# Patient Record
Sex: Female | Born: 1980 | Race: White | Hispanic: No | Marital: Single | Smoking: Current every day smoker
Health system: Southern US, Community
[De-identification: ages and names within clinical notes are randomized; demographics above are authoritative.]

## PROBLEM LIST (undated history)

## (undated) DIAGNOSIS — O039 Complete or unspecified spontaneous abortion without complication: Secondary | ICD-10-CM

## (undated) DIAGNOSIS — K219 Gastro-esophageal reflux disease without esophagitis: Secondary | ICD-10-CM

## (undated) HISTORY — PX: DILATION AND CURETTAGE OF UTERUS: SHX78

## (undated) HISTORY — DX: Gastro-esophageal reflux disease without esophagitis: K21.9

## (undated) HISTORY — PX: CHOLECYSTECTOMY, LAPAROSCOPIC: SHX56

## (undated) HISTORY — DX: Complete or unspecified spontaneous abortion without complication: O03.9

---

## 2004-04-15 ENCOUNTER — Emergency Department: Payer: Self-pay | Admitting: Emergency Medicine

## 2004-04-16 ENCOUNTER — Ambulatory Visit: Payer: Self-pay | Admitting: Emergency Medicine

## 2004-04-19 ENCOUNTER — Ambulatory Visit: Payer: Self-pay

## 2004-06-01 ENCOUNTER — Emergency Department: Payer: Self-pay | Admitting: Emergency Medicine

## 2004-06-05 ENCOUNTER — Ambulatory Visit: Payer: Self-pay | Admitting: Internal Medicine

## 2004-06-06 ENCOUNTER — Ambulatory Visit: Payer: Self-pay | Admitting: Internal Medicine

## 2004-06-06 ENCOUNTER — Inpatient Hospital Stay: Payer: Self-pay | Admitting: Surgery

## 2004-06-17 ENCOUNTER — Ambulatory Visit: Payer: Self-pay | Admitting: Surgery

## 2004-11-18 ENCOUNTER — Emergency Department: Payer: Self-pay | Admitting: Emergency Medicine

## 2004-11-19 ENCOUNTER — Emergency Department: Payer: Self-pay | Admitting: Unknown Physician Specialty

## 2005-05-23 ENCOUNTER — Ambulatory Visit: Payer: Self-pay | Admitting: Obstetrics & Gynecology

## 2005-06-03 ENCOUNTER — Ambulatory Visit: Payer: Self-pay | Admitting: Unknown Physician Specialty

## 2005-07-10 ENCOUNTER — Inpatient Hospital Stay: Payer: Self-pay | Admitting: Unknown Physician Specialty

## 2006-03-14 ENCOUNTER — Emergency Department: Payer: Self-pay | Admitting: Emergency Medicine

## 2007-08-09 ENCOUNTER — Emergency Department: Payer: Self-pay | Admitting: Emergency Medicine

## 2008-06-21 ENCOUNTER — Emergency Department: Payer: Self-pay | Admitting: Emergency Medicine

## 2009-08-03 ENCOUNTER — Emergency Department: Payer: Self-pay

## 2009-09-22 ENCOUNTER — Emergency Department: Payer: Self-pay | Admitting: Internal Medicine

## 2010-06-17 ENCOUNTER — Emergency Department: Payer: Self-pay | Admitting: Emergency Medicine

## 2010-06-21 ENCOUNTER — Emergency Department: Payer: Self-pay | Admitting: Unknown Physician Specialty

## 2010-07-03 ENCOUNTER — Emergency Department: Payer: Self-pay | Admitting: Emergency Medicine

## 2012-02-10 ENCOUNTER — Emergency Department: Payer: Self-pay | Admitting: Emergency Medicine

## 2012-02-10 LAB — COMPREHENSIVE METABOLIC PANEL
Albumin: 3.3 g/dL — ABNORMAL LOW (ref 3.4–5.0)
Alkaline Phosphatase: 82 U/L (ref 50–136)
Bilirubin,Total: 0.3 mg/dL (ref 0.2–1.0)
Calcium, Total: 8.9 mg/dL (ref 8.5–10.1)
Chloride: 112 mmol/L — ABNORMAL HIGH (ref 98–107)
Creatinine: 0.67 mg/dL (ref 0.60–1.30)
EGFR (African American): >60
EGFR (Non-African Amer.): >60
Glucose: 89 mg/dL (ref 65–99)
SGPT (ALT): 18 U/L (ref 12–78)
Sodium: 143 mmol/L (ref 136–145)

## 2012-02-10 LAB — CBC
HGB: 14.2 g/dL (ref 12.0–16.0)
MCH: 30.6 pg (ref 26.0–34.0)
MCHC: 34.8 g/dL (ref 32.0–36.0)
MCV: 88 fL (ref 80–100)
Platelet: 156 10*3/uL (ref 150–440)
WBC: 10 10*3/uL (ref 3.6–11.0)

## 2012-05-26 ENCOUNTER — Emergency Department: Payer: Self-pay | Admitting: Emergency Medicine

## 2012-05-26 LAB — URINALYSIS, COMPLETE
Bilirubin,UR: NEGATIVE
Glucose,UR: NEGATIVE mg/dL (ref 0–75)
Hyaline Cast: 7
Ketone: NEGATIVE
Leukocyte Esterase: NEGATIVE
Ph: 7 (ref 4.5–8.0)
Protein: NEGATIVE
RBC,UR: 418 /HPF (ref 0–5)
Specific Gravity: 1.026 (ref 1.003–1.030)
Squamous Epithelial: 2
WBC UR: 2 /HPF (ref 0–5)

## 2012-05-26 LAB — COMPREHENSIVE METABOLIC PANEL
Alkaline Phosphatase: 85 U/L (ref 50–136)
Anion Gap: 9 (ref 7–16)
Bilirubin,Total: 0.5 mg/dL (ref 0.2–1.0)
Calcium, Total: 9.8 mg/dL (ref 8.5–10.1)
Chloride: 107 mmol/L (ref 98–107)
Co2: 24 mmol/L (ref 21–32)
EGFR (African American): >60
Potassium: 4.1 mmol/L (ref 3.5–5.1)
SGOT(AST): 16 U/L (ref 15–37)
SGPT (ALT): 20 U/L (ref 12–78)
Sodium: 140 mmol/L (ref 136–145)
Total Protein: 8.1 g/dL (ref 6.4–8.2)

## 2012-05-26 LAB — PREGNANCY, URINE: Pregnancy Test, Urine: NEGATIVE m[IU]/mL

## 2012-05-26 LAB — CBC
HCT: 48.4 % — ABNORMAL HIGH (ref 35.0–47.0)
HGB: 16.5 g/dL — ABNORMAL HIGH (ref 12.0–16.0)
MCH: 30.5 pg (ref 26.0–34.0)
MCHC: 34 g/dL (ref 32.0–36.0)
Platelet: 219 10*3/uL (ref 150–440)

## 2012-07-15 IMAGING — CT CT ABD-PELV W/O CM
1 of 2 series · 15 of 32 positions shown, 19 images · non-contrast
Comparison: none

REASON FOR EXAM: (1) left flank pain; (2) left flank pain
COMMENTS:

PROCEDURE:     CT  - CT ABDOMEN AND PELVIS W[DATE]  [DATE]
RESULT:
Comparison is made to a prior study dated 08/09/2007.
TECHNIQUE: Helical noncontrast 3 mm sections were obtained from the lung
bases through the pubic symphysis.

[Series 2: stone · axial · 0.77mm/px · z∈[-1022,-608]mm · 15 of 152 slices shown, 19 images]
[im 7/152  soft-tissue]
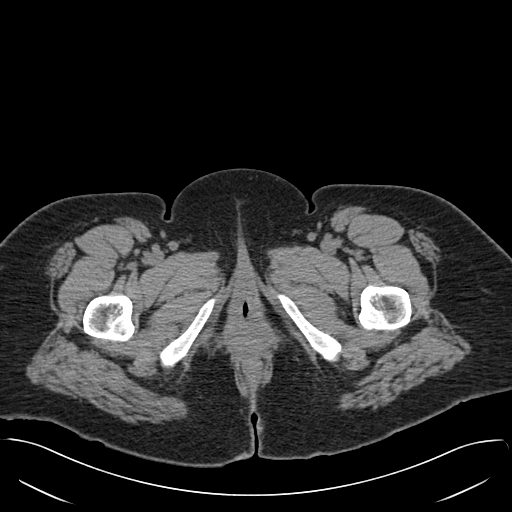
[im 7/152  bone]
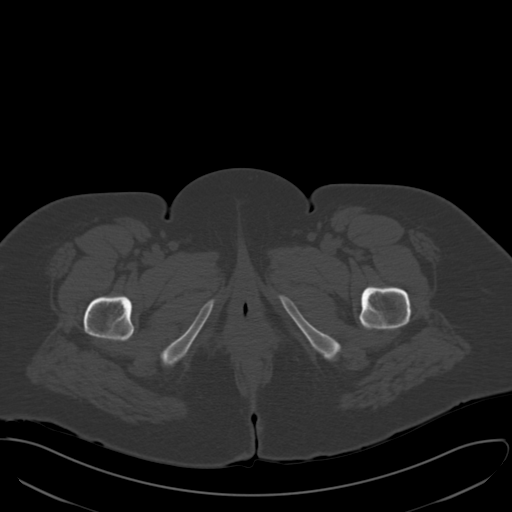
[im 19/152  soft-tissue]
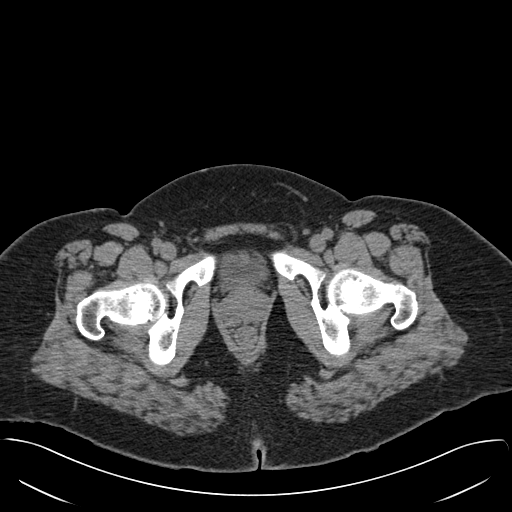
[im 31/152  soft-tissue]
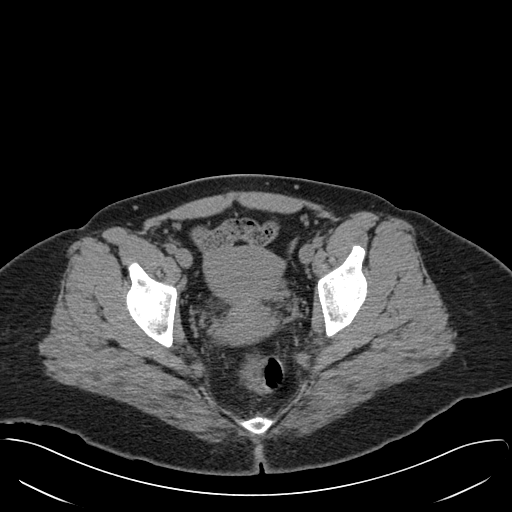
[im 43/152  soft-tissue]
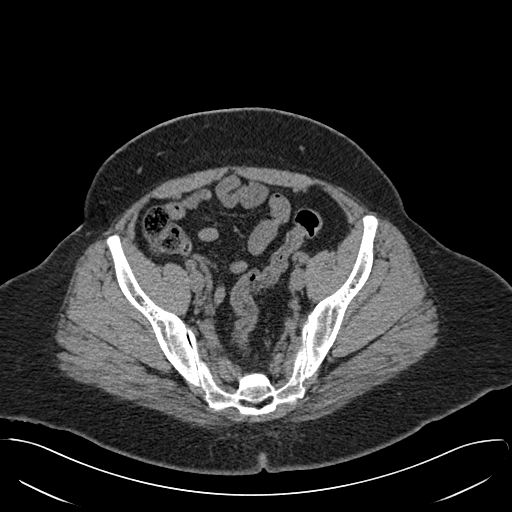
[im 55/152  soft-tissue]
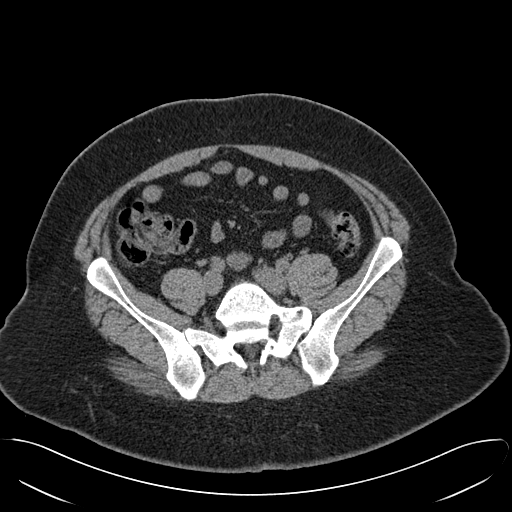
[im 67/152  soft-tissue]
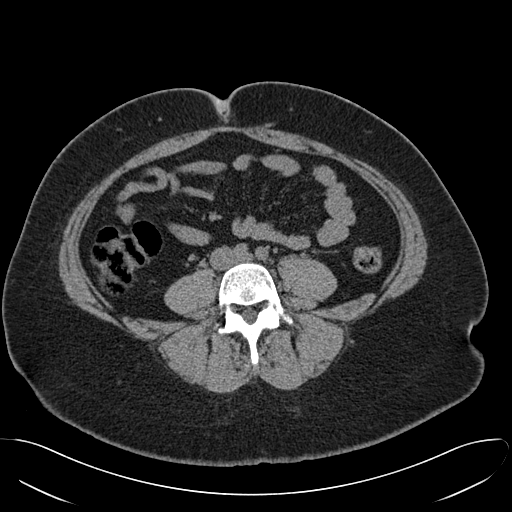
[im 79/152  soft-tissue]
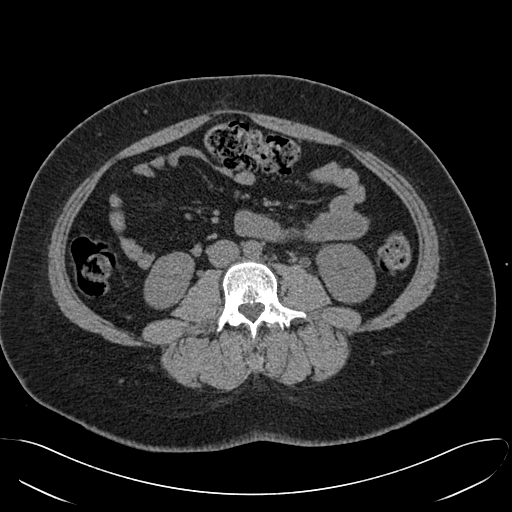
[im 85/152  soft-tissue]
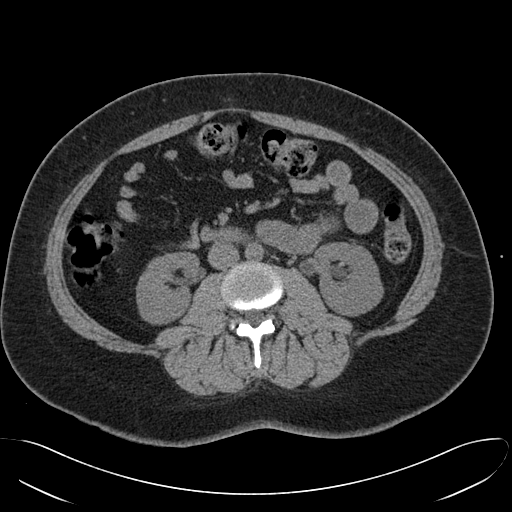
[im 97/152  soft-tissue]
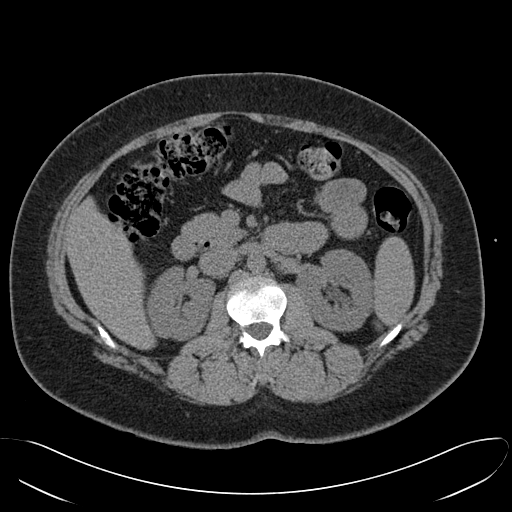
[im 97/152  bone]
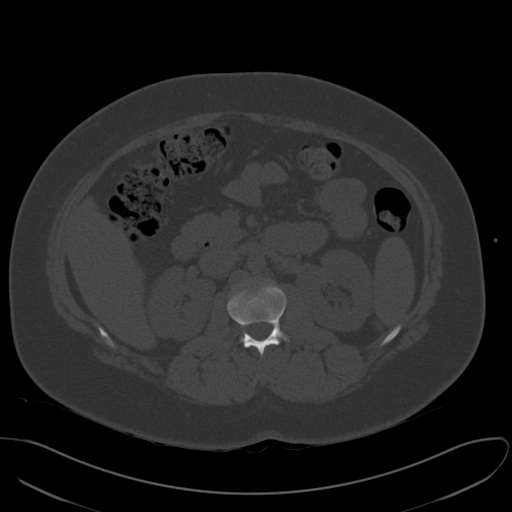
[im 109/152  soft-tissue]
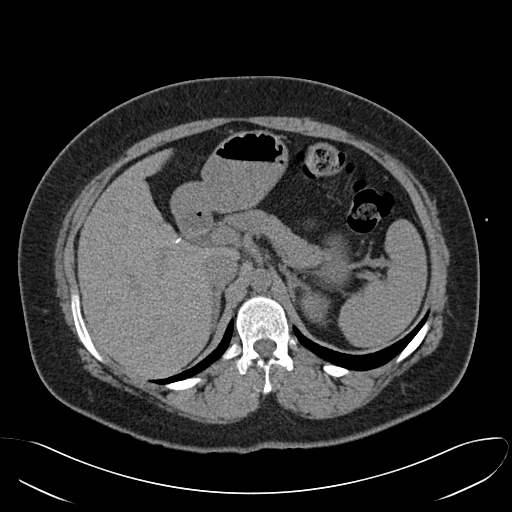
[im 121/152  soft-tissue]
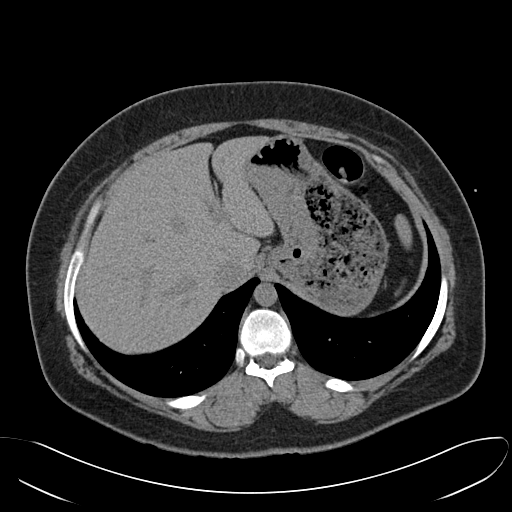
[im 127/152  lung]
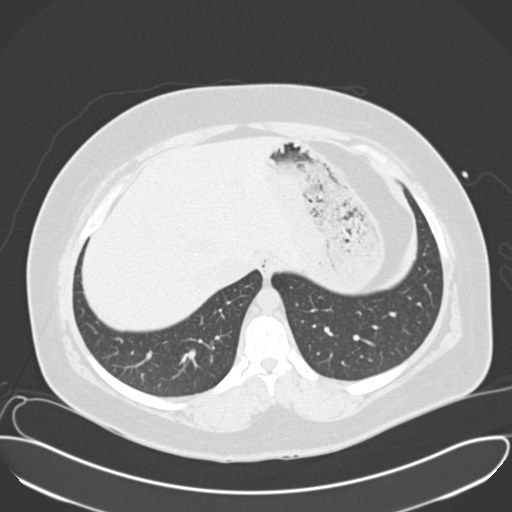
[im 133/152  soft-tissue]
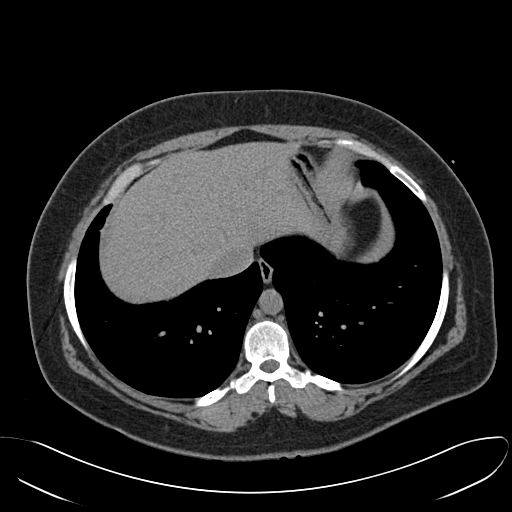
[im 133/152  lung]
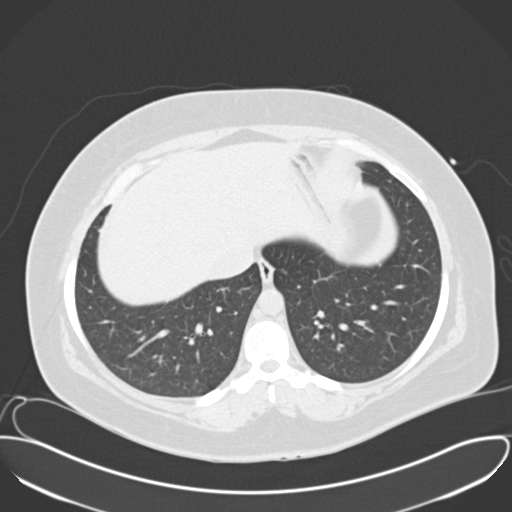
[im 139/152  lung]
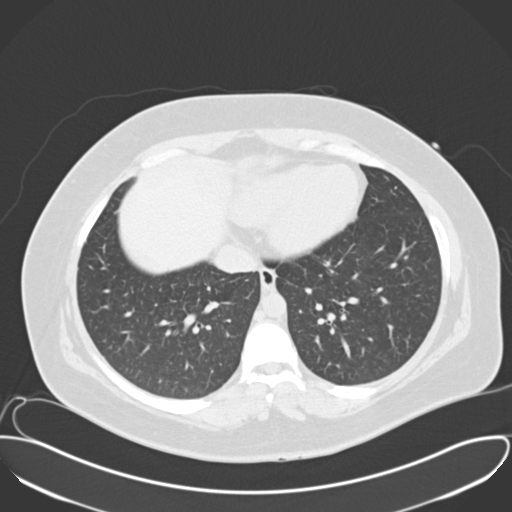
[im 145/152  soft-tissue]
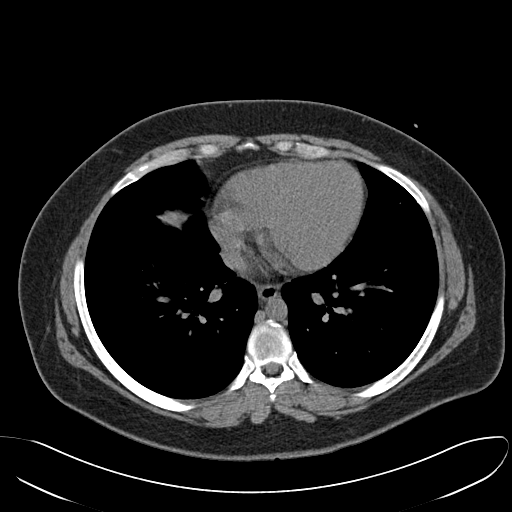
[im 145/152  lung]
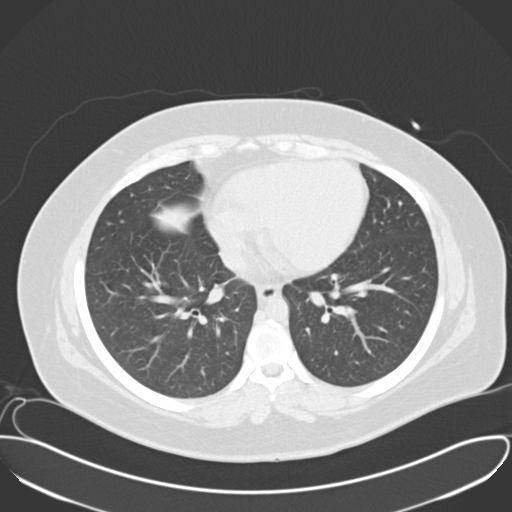

[15 of 32 positions shown; findings below may reference images not displayed]

FINDINGS: The lung bases are unremarkable.

Noncontrast evaluation of the liver, spleen, adrenals and pancreas is
unremarkable. Evaluation of the left kidney demonstrates mild hydronephrosis
with mild hydroureter, proximally. There is otherwise no further evidence of
hydronephrosis, hydroureter, nephrolithiasis or ureterolithiasis.

There is no CT evidence of bowel obstruction or secondary signs reflecting
enteritis, colitis, diverticulitis or appendicitis within the limitations of
a noncontrast CT. There is no evidence of an abdominal aortic aneurysm.
IMPRESSION: 1.  Proximal left ureteral calculus with associated mild obstructive
uropathy.
2.  Dr. Jumper of the Emergency Department was informed of these findings
via a preliminary faxed report.

## 2014-06-24 IMAGING — US US RENAL KIDNEY
1 series · 14 of 24 positions shown · non-contrast
Comparison: none

REASON FOR EXAM: left flank pain
COMMENTS:

PROCEDURE:     US  - US KIDNEY  - May 26, 2012  [DATE]
RESULT:     Comparison: 06/21/2010
TECHNIQUE: Multiple grayscale and color Doppler images were obtained of the
bilateral kidneys.

[Series 1: us renal kidney · 0.28mm/px · 14 of 24 slices shown]
[im 1/24]
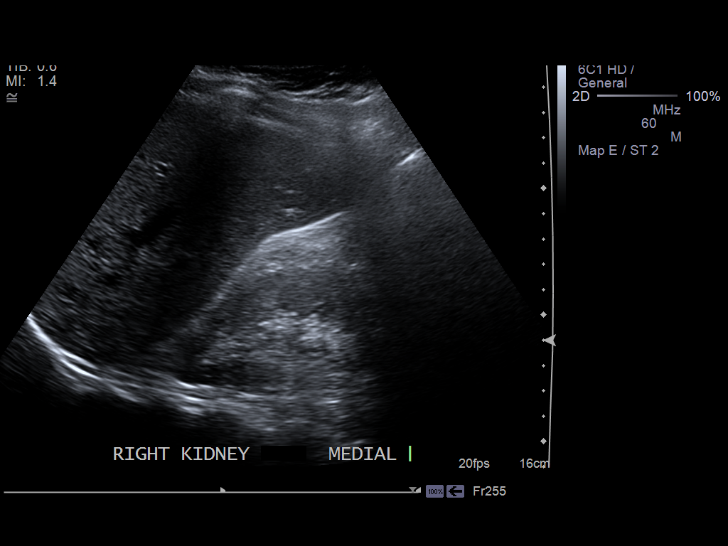
[im 3/24]
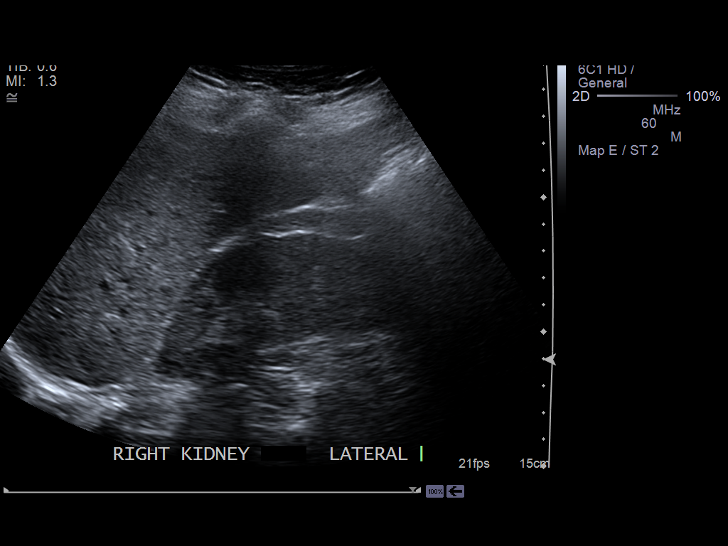
[im 5/24]
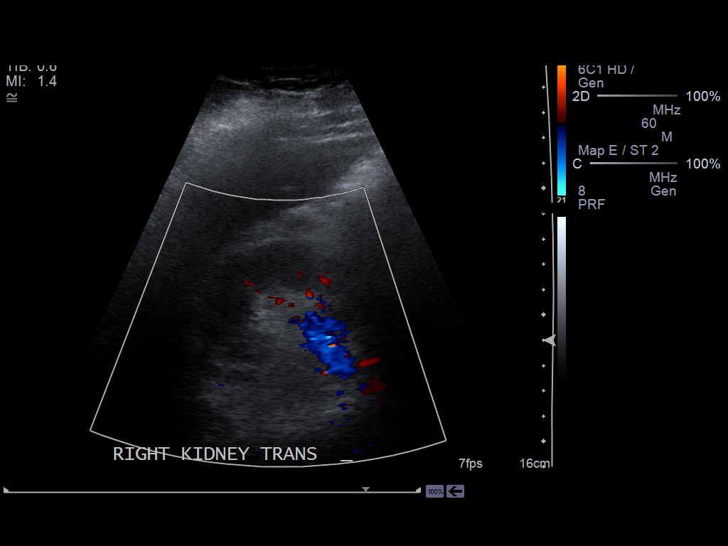
[im 7/24]
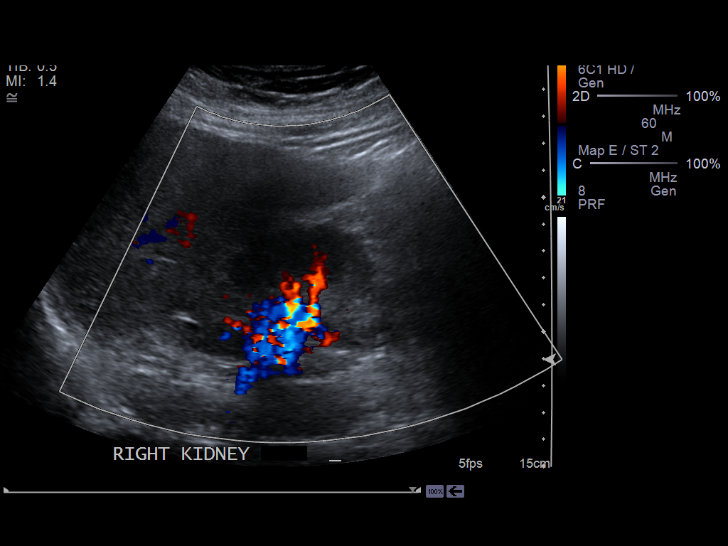
[im 8/24]
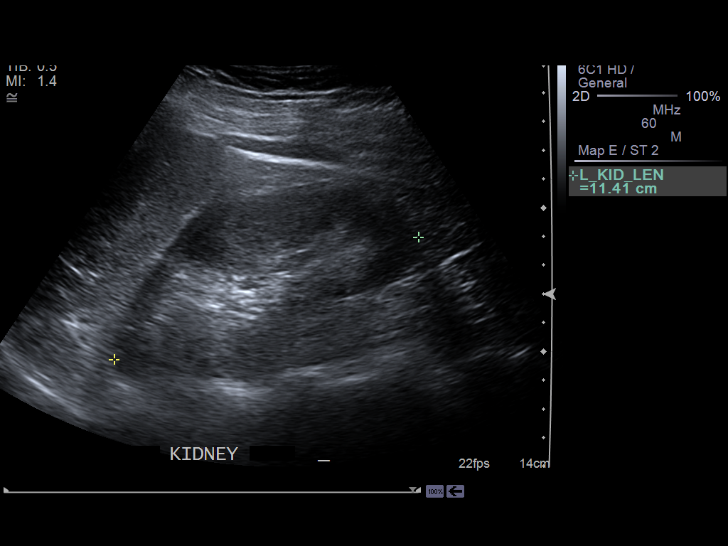
[im 10/24]
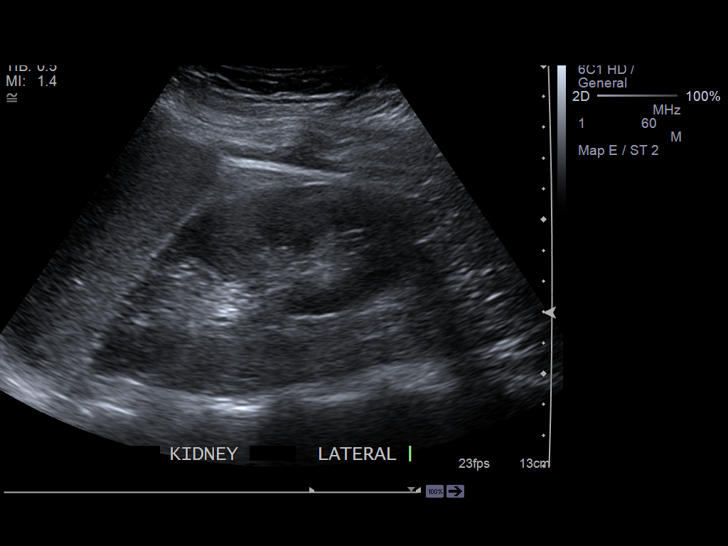
[im 12/24]
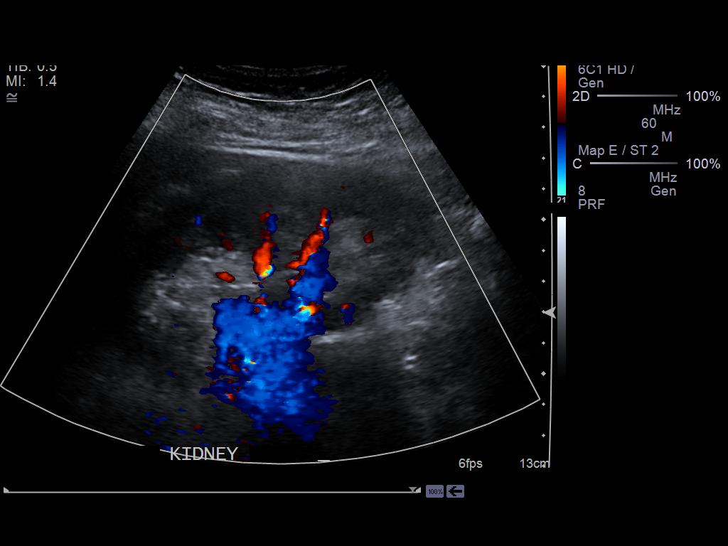
[im 13/24]
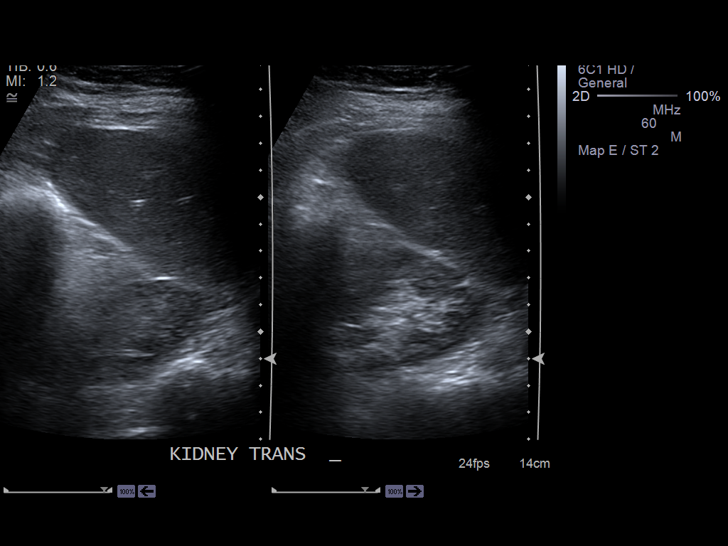
[im 15/24]
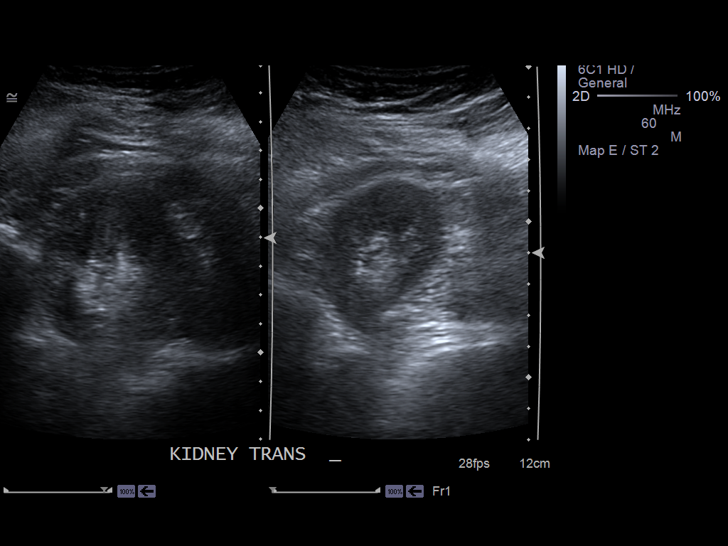
[im 17/24]
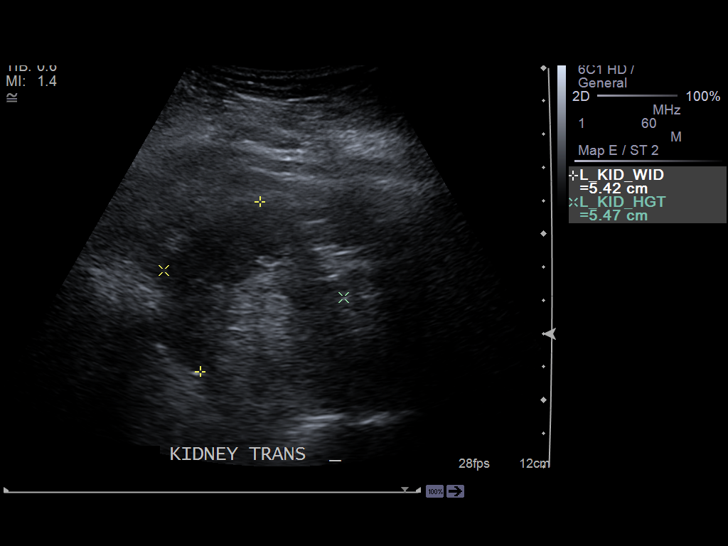
[im 19/24]
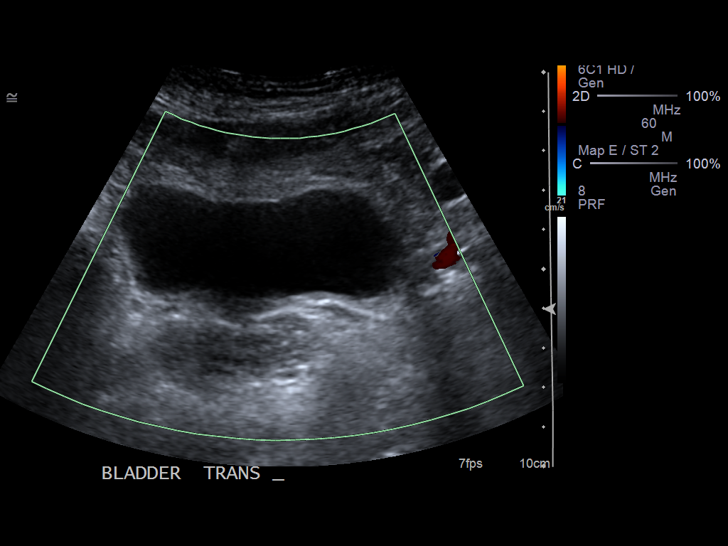
[im 20/24]
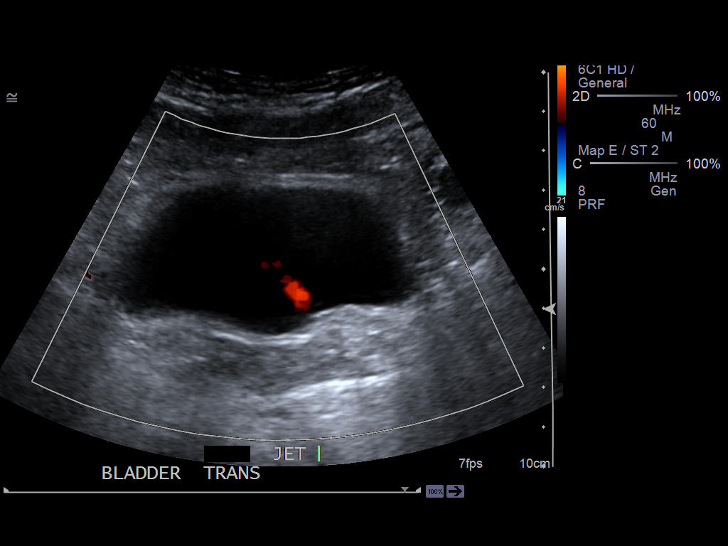
[im 22/24]
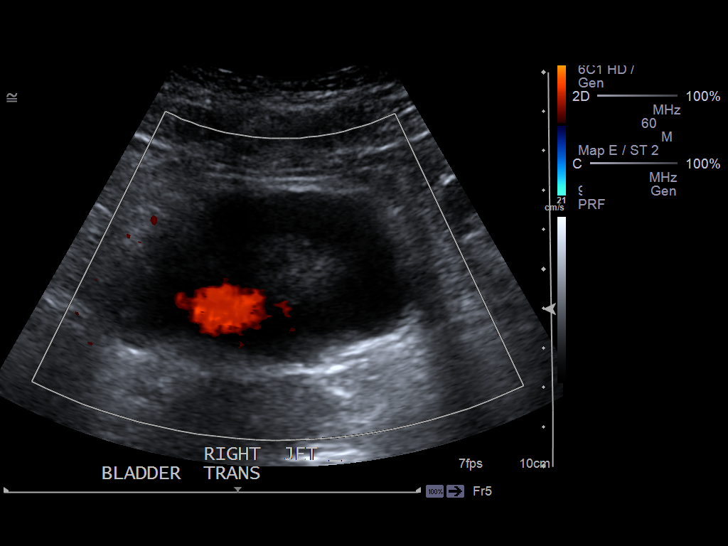
[im 24/24]
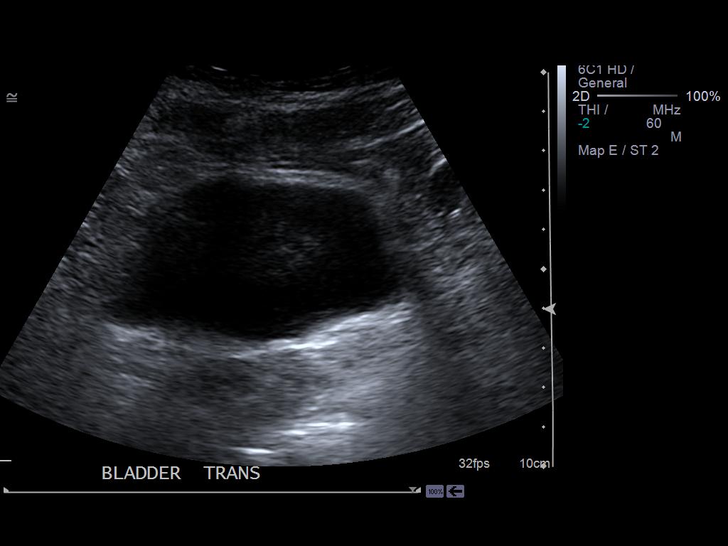

[14 of 24 positions shown; findings below may reference images not displayed]

FINDINGS: The right kidney measures 9.9 x 6.0 x 4.5 cm. The left kidney measures
x 5.4 x 5.5 cm. There is no hydronephrosis. No shadowing hyperechoic foci to
suggest renal calculi. Ureteral jets are seen bilaterally in the bladder.
IMPRESSION: No hydronephrosis.

[REDACTED]

## 2015-03-20 LAB — HM PAP SMEAR: HM Pap smear: NEGATIVE

## 2018-12-08 DIAGNOSIS — Z8632 Personal history of gestational diabetes: Secondary | ICD-10-CM

## 2018-12-09 ENCOUNTER — Ambulatory Visit: Payer: Self-pay

## 2018-12-20 ENCOUNTER — Ambulatory Visit: Payer: Self-pay

## 2018-12-20 ENCOUNTER — Ambulatory Visit (LOCAL_COMMUNITY_HEALTH_CENTER): Payer: Self-pay | Admitting: Family Medicine

## 2018-12-20 ENCOUNTER — Encounter: Payer: Self-pay | Admitting: Family Medicine

## 2018-12-20 ENCOUNTER — Other Ambulatory Visit: Payer: Self-pay

## 2018-12-20 VITALS — BP 141/99 | Ht 59.5 in | Wt 150.8 lb

## 2018-12-20 DIAGNOSIS — Z30013 Encounter for initial prescription of injectable contraceptive: Secondary | ICD-10-CM

## 2018-12-20 DIAGNOSIS — Z3009 Encounter for other general counseling and advice on contraception: Secondary | ICD-10-CM

## 2018-12-20 MED ORDER — MEDROXYPROGESTERONE ACETATE 150 MG/ML IM SUSP
150.0000 mg | Freq: Once | INTRAMUSCULAR | Status: AC
  Administered 2018-12-20: 150 mg via INTRAMUSCULAR

## 2018-12-20 NOTE — Progress Notes (Signed)
Folic acid counseling completed - MVI declined. Counseled client that PAP was due and per client, can only stay at appt long enough to get Depo due to ride waiting (states does not drive). Consult with Dr. Ernestina Patches regarding BP reading and per her verbal order 1) administer Depo 150 mg IM today and 2) encourage PCP evaluation of BP -[facility] info sheet given. Client tolerated Depo without complaint. Rich Number, RN

## 2018-12-21 NOTE — Progress Notes (Signed)
Attestation of Medical Director for clinical support staff: I agree with the care provided to this patient and was available for any consultation.  I was consulted at the point of care and documentation reflects my recommendations.  Altheia Shafran Niles Doris Gruhn, MD, MPH, ABFM [facility] Medical Director  

## 2019-03-16 ENCOUNTER — Ambulatory Visit: Payer: Self-pay

## 2019-03-22 ENCOUNTER — Ambulatory Visit: Payer: Self-pay

## 2019-03-30 ENCOUNTER — Ambulatory Visit: Payer: Self-pay

## 2019-04-13 ENCOUNTER — Ambulatory Visit: Payer: Self-pay

## 2019-04-28 ENCOUNTER — Other Ambulatory Visit: Payer: Self-pay

## 2019-04-28 ENCOUNTER — Ambulatory Visit (LOCAL_COMMUNITY_HEALTH_CENTER): Payer: Self-pay | Admitting: Physician Assistant

## 2019-04-28 ENCOUNTER — Encounter: Payer: Self-pay | Admitting: Physician Assistant

## 2019-04-28 VITALS — BP 131/88 | Ht 59.5 in | Wt 157.6 lb

## 2019-04-28 DIAGNOSIS — Z309 Encounter for contraceptive management, unspecified: Secondary | ICD-10-CM

## 2019-04-28 MED ORDER — MEDROXYPROGESTERONE ACETATE 150 MG/ML IM SUSP
150.0000 mg | Freq: Once | INTRAMUSCULAR | Status: AC
  Administered 2019-04-28: 17:00:00 150 mg via INTRAMUSCULAR

## 2019-04-28 NOTE — Progress Notes (Signed)
  Family Planning Visit- Repeat Yearly Visit  Subjective:  Nancy Mccullough is a 39 y.o. being seen today for an well woman visit and to discuss family planning options.    She is currently using Depo-Provera injections for pregnancy prevention. Patient reports she does not want a pregnancy in the next year. Patient  has History of gestational diabetes on their problem list.  Chief Complaint  Patient presents with  . Contraception    Depo    Patient she feels well, reports no problems with DMPA. Last sex 3 weeks ago (04/01/19) Last DMPA 12/20/18 ([redacted]w[redacted]d ago). Last Pap/HRHPV neg 03/20/15.  Patient denies STD sx.   Does the patient desire a pregnancy in the next year? (OKQ flowsheet)  See flowsheet for other program required questions.   Body mass index is 31.3 kg/m. - Patient is eligible for diabetes screening based on BMI and age >107?  no HA1C ordered? no  Patient reports 1 of partners in last year. Desires STI screening?  No - only wants contraception today  Does the patient have a current or past history of drug use? No   No components found for: HCV]   Health Maintenance Due  Topic Date Due  . HIV Screening  09/20/1997  . TETANUS/TDAP  09/20/2001  . INFLUENZA VACCINE  10/30/2018    Review of Systems  All other systems reviewed and are negative.   The following portions of the patient's history were reviewed and updated as appropriate: allergies, current medications, past family history, past medical history, past social history, past surgical history and problem list. Problem list updated.  Objective:   Vitals:   04/28/19 1452  BP: 131/88  Weight: 157 lb 9.6 oz (71.5 kg)  Height: 4' 11.5" (1.511 m)    Physical Exam Pulmonary:     Effort: Pulmonary effort is normal.  Neurological:     Mental Status: She is alert and oriented to person, place, and time.  Psychiatric:        Mood and Affect: Mood normal.     Assessment and Plan:  Nancy Mccullough is a 39 y.o.  female presenting to the [facility] Department for an initial well woman exam/family planning visit  Contraception counseling: Reviewed all forms of birth control options in the tiered based approach. available including abstinence; over the counter/barrier methods; hormonal contracDvarious tubal sterilization modalities. Risks, benefits, and typical effectiveness rates were reviewed.  Questions were answered.  Written information was also given toMe patient to review.  Patient desires DMPA, this was prescribed for patient. She will follow up in  12 weeks for surveillance.  She was told to call with any further questions, or with any concerns about this method of contraception.  Emphasized use of condoms 100% of the time for STI prevention.  Patient was not offered ECP.  1. Encounter for contraceptive management, unspecified type DMPA 150mg  IM today, with backup contraception x 1 week. RP/Pap due 02/2020. - medroxyPROGESTERone (DEPO-PROVERA) injection 150 mg  Return in about 12 weeks (around 07/21/2019) for Routine DMPA injection.  No future appointments.  07/23/2019, PA-C

## 2019-04-28 NOTE — Progress Notes (Signed)
Verbal order for Depo, per A. Streilein, [place]; discussed alternating injection sites-client prefers same Sharlette Dense, RN

## 2019-04-28 NOTE — Progress Notes (Signed)
RN to lobby ~ 5 minutes ago and client not present. Client also not in Nurse Clinic hallway waiting room. Per [place], client told her she was "going downstairs for just a minute." RN will continue to assess waiting room for client's return. Jossie Ng, RN  Folic acid counseling completed and MVI declined. Declines PAP today as not yet had shower today. Declines STI testing and PAP today.Declines tetanus and flu vaccines today. Jossie Ng, RN

## 2019-08-05 ENCOUNTER — Ambulatory Visit: Payer: Self-pay

## 2019-08-15 ENCOUNTER — Ambulatory Visit: Payer: Self-pay

## 2019-08-22 ENCOUNTER — Ambulatory Visit: Payer: Self-pay

## 2019-08-30 ENCOUNTER — Ambulatory Visit: Payer: Self-pay

## 2019-09-13 ENCOUNTER — Ambulatory Visit: Payer: Self-pay

## 2019-09-19 ENCOUNTER — Ambulatory Visit: Payer: Self-pay

## 2019-09-27 ENCOUNTER — Ambulatory Visit: Payer: Self-pay

## 2019-10-07 ENCOUNTER — Ambulatory Visit: Payer: Self-pay

## 2019-10-25 ENCOUNTER — Ambulatory Visit: Payer: Self-pay

## 2021-06-04 ENCOUNTER — Telehealth: Payer: Self-pay

## 2021-06-04 NOTE — Telephone Encounter (Signed)
Nancy Mccullough called from jail saying patient will not be coming to her appointment on 06/07/21 as she is positive for covid and will be cleared on 06/10/21.  ?Darl Pikes also states that they believe she is miscarrying and she is getting Bhcg draw today and again in 5 days. Per Darl Pikes if she is not miscarrying then she will return the phone call to reschedule her.  ? ?Floy Sabina, RN ? ?

## 2022-03-28 ENCOUNTER — Ambulatory Visit: Payer: Self-pay

## 2022-04-11 ENCOUNTER — Ambulatory Visit (LOCAL_COMMUNITY_HEALTH_CENTER): Payer: Self-pay | Admitting: Family

## 2022-04-11 ENCOUNTER — Encounter: Payer: Self-pay | Admitting: Family

## 2022-04-11 ENCOUNTER — Ambulatory Visit: Payer: Self-pay

## 2022-04-11 VITALS — BP 123/86 | Ht 59.5 in | Wt 194.2 lb

## 2022-04-11 DIAGNOSIS — Z30013 Encounter for initial prescription of injectable contraceptive: Secondary | ICD-10-CM

## 2022-04-11 DIAGNOSIS — Z0000 Encounter for general adult medical examination without abnormal findings: Secondary | ICD-10-CM

## 2022-04-11 DIAGNOSIS — Z309 Encounter for contraceptive management, unspecified: Secondary | ICD-10-CM

## 2022-04-11 DIAGNOSIS — R030 Elevated blood-pressure reading, without diagnosis of hypertension: Secondary | ICD-10-CM

## 2022-04-11 MED ORDER — MEDROXYPROGESTERONE ACETATE 150 MG/ML IM SUSP
150.0000 mg | INTRAMUSCULAR | Status: DC
  Administered 2022-04-11 – 2023-04-02 (×4): 150 mg via INTRAMUSCULAR

## 2022-04-11 NOTE — Progress Notes (Signed)
Pt is here for PE and Depo.  Depo 150 mg given IM in LUOQ without any complications.  Pt tolerated well.  Pt given reminder card to return in 11-13 weeks for next Depo. Windle Guard, RN

## 2022-04-25 NOTE — Progress Notes (Signed)
[facility] [place] Number: [phone]  Family Planning Visit- Repeat Yearly Visit  Subjective:  Nancy Mccullough is a 42 y.o. G5P0  being seen today for an annual wellness visit and to discuss contraception options.   The patient is currently using Hormonal Injection for pregnancy prevention. Patient does not want a pregnancy in the next year.    report they are looking for a method that provides Methods that does not involve too much memory   Patient has the following medical problems: has History of gestational diabetes and Well woman exam (no gynecological exam) on their problem list.  Chief Complaint  Patient presents with   Annual Exam    PE and Depo    Patient reports wanting to restart Depo, last given in May 2023. Also states that a Pap smear was performed at the same visit by a medical service that was helping her to transition when she was released from jail.  Patient denies chest pain, SOB, dizziness, or palpitations.   See flowsheet for other program required questions.   Body mass index is 38.57 kg/m. - Patient is eligible for diabetes screening based on BMI> 25 and age >35?  yes HA1C ordered? No, patient refused  Patient reports 0 partners in last year. Desires STI screening?  no   Has patient been screened once for HCV in the past?  Yes  No results found for: "HCVAB"  Does the patient have current of drug use, have a partner with drug use, and/or has been incarcerated since last result? Yes  If yes-- Screen for HCV through [facility]   Does the patient meet criteria for HBV testing? Yes  Criteria:  -Household, sexual or needle sharing contact with HBV -History of drug use -HIV positive -Those with known Hep C   Health Maintenance Due  Topic Date Due   COVID-19 Vaccine (1) Never done   HIV Screening  Never done   Hepatitis C Screening  Never done   DTaP/Tdap/Td (1 - Tdap) Never  done   PAP SMEAR-Modifier  03/19/2020   INFLUENZA VACCINE  Never done    Review of Systems  All other systems reviewed and are negative.   The following portions of the patient's history were reviewed and updated as appropriate: allergies, current medications, past family history, past medical history, past social history, past surgical history and problem list. Problem list updated.  Objective:   Vitals:   04/11/22 1439 04/11/22 1459  BP: (!) 140/100 123/86  Weight: 194 lb 3.2 oz (88.1 kg)   Height: 4' 11.5" (1.511 m)    PATIENT DECLINES COMPLETE PE, BRIEF ONLY Physical Exam Vitals and nursing note reviewed.  Constitutional:      Appearance: Normal appearance.  Cardiovascular:     Rate and Rhythm: Normal rate and regular rhythm.     Pulses: Normal pulses.     Heart sounds: Normal heart sounds.  Pulmonary:     Effort: Pulmonary effort is normal.     Breath sounds: Normal breath sounds.  Genitourinary:    Comments: Genital exam deferred- no concerns today Skin:    General: Skin is warm and dry.  Neurological:     Mental Status: She is alert and oriented to person, place, and time.     Assessment and Plan:  Nancy Mccullough is a 42 y.o. female G5P0 presenting to the [facility] Department for an yearly wellness and contraception visit  Contraception counseling: Reviewed options based on patient desire and reproductive life plan. Patient is interested in Hormonal Injection. This was provided to the patient today.   Risks, benefits, and typical effectiveness rates were reviewed.  Questions were answered.  Written information was also given to the patient to review.    The patient will follow up in  3 months for surveillance.  The patient was told to call with any further questions, or with any concerns about this method of contraception.  Emphasized use of condoms 100% of the time for STI prevention.  Patient was assessed for need for ECP, not a candidate.  1.  Well woman exam (no gynecological exam) Brief PE only, patient has time constraints and just wants Depo today Needs to return to complete her PE Record request sent to MD where last PAP was done - medroxyPROGESTERone (DEPO-PROVERA) injection 150 mg  2. Encounter for initial prescription of injectable contraceptive Record request sent to MD where last Depo was given.  Depo Provera 150mg  q11-13 weeks with 4 refills  Use condoms for next 7 days as backup contraception.   3. Class 3 severe obesity due to excess calories in adult, unspecified BMI, unspecified whether serious comorbidity present ([place]) Discussed weight loss through diet and exercise. Advised healthy ways to control increase appetite secondary to DMPA. Needs to return for A1C testing.  4. Elevated blood pressure reading in office without diagnosis of hypertension Discussed low sodium diet and increased hydration F/U with PCP for evaluation, resource list given     Return in about 11 weeks (around 06/27/2022) for Depo injection.  No future appointments.  Marline Backbone, FNP

## 2022-06-30 ENCOUNTER — Ambulatory Visit (LOCAL_COMMUNITY_HEALTH_CENTER): Payer: Medicaid Other

## 2022-06-30 VITALS — BP 146/89 | Wt 206.0 lb

## 2022-06-30 DIAGNOSIS — Z309 Encounter for contraceptive management, unspecified: Secondary | ICD-10-CM

## 2022-06-30 DIAGNOSIS — Z3009 Encounter for other general counseling and advice on contraception: Secondary | ICD-10-CM

## 2022-06-30 DIAGNOSIS — Z30013 Encounter for initial prescription of injectable contraceptive: Secondary | ICD-10-CM | POA: Diagnosis not present

## 2022-06-30 NOTE — Progress Notes (Signed)
11weeks 3 days post depo. Voices no concerns. Depo given today per order K.House, FNP dated 04/11/22. Tolerated well LUOQ. Next depo due 09/15/22. Pt had elevated BP 144/100 and rechecked 146/89. Consulted with provider E.Sciora, CNM. Pt noted to have elevated bp in Jan 2024 . Per provider okay to give depo today,counseledpt on f/u with provider to eval high BP and limit caffeine intake. Pt does not have PCP. Resource list given. Advised pt need to make appt to f/u on BP,asap. Pt stated she drank a [employer] energy drink prior to appointment. Advised pt to limit caffeine intake. Pt has also had increase in weight which concerns her and not able to exercise.

## 2022-09-30 ENCOUNTER — Ambulatory Visit: Payer: MEDICAID

## 2022-09-30 VITALS — BP 126/85 | Ht 59.5 in | Wt 209.0 lb

## 2022-09-30 DIAGNOSIS — Z30013 Encounter for initial prescription of injectable contraceptive: Secondary | ICD-10-CM | POA: Diagnosis not present

## 2022-09-30 DIAGNOSIS — Z3009 Encounter for other general counseling and advice on contraception: Secondary | ICD-10-CM | POA: Diagnosis not present

## 2022-09-30 DIAGNOSIS — Z309 Encounter for contraceptive management, unspecified: Secondary | ICD-10-CM

## 2022-09-30 NOTE — Progress Notes (Signed)
13 weeks 1 day post depo. Voices no concerns. BP WNL today. Has not established PCP as she reports area providers are not accepting new patients and she has been checking BP at home and it has been normal. RN encouraged pt to establish PCP. PCP resource list offered, but pt declines as she has one at home.   Per Epic, patient records were received from [facility] provider and was seen by Colvin Caroli, FNP on 05/15/2022.   Depo given today per order by Aron Baba, FNP dated 04/11/2022. Tolerated well RUOQ. Next depo due 12/16/2022 and pt has reminder. Jerel Shepherd, RN

## 2023-01-02 ENCOUNTER — Ambulatory Visit: Payer: MEDICAID

## 2023-01-14 ENCOUNTER — Ambulatory Visit: Payer: MEDICAID

## 2023-01-14 VITALS — BP 136/84 | Ht 59.0 in | Wt 208.5 lb

## 2023-01-14 DIAGNOSIS — Z3042 Encounter for surveillance of injectable contraceptive: Secondary | ICD-10-CM

## 2023-01-14 DIAGNOSIS — Z0000 Encounter for general adult medical examination without abnormal findings: Secondary | ICD-10-CM

## 2023-01-14 DIAGNOSIS — Z309 Encounter for contraceptive management, unspecified: Secondary | ICD-10-CM

## 2023-01-14 DIAGNOSIS — Z3009 Encounter for other general counseling and advice on contraception: Secondary | ICD-10-CM

## 2023-01-14 NOTE — Progress Notes (Signed)
15 weeks 1 day post depo.  Desires to continue depo.  Denies menses with depo but states bled a month ago for about 13 days and thinks it was a cyst.  Denies any bleeding since.    Depo given IM LUOQ as ordered by Colvin Caroli FNP dated 04/11/22; tolerated well.   Next depo due 04/01/23 and suggested keep return depo appt as close to possible at 11 week intervals. Appt reminder given.  Informed pt annual exam due on or after 04/13/23; states she will most likely come for depo and then schedule PE after that.  Cherlynn Polo, RN

## 2023-03-02 ENCOUNTER — Emergency Department: Payer: MEDICAID

## 2023-03-02 ENCOUNTER — Other Ambulatory Visit: Payer: Self-pay

## 2023-03-02 ENCOUNTER — Emergency Department
Admission: EM | Admit: 2023-03-02 | Discharge: 2023-03-02 | Disposition: A | Discharge status: Home / Self Care | Payer: MEDICAID | Attending: Emergency Medicine | Admitting: Emergency Medicine

## 2023-03-02 DIAGNOSIS — R202 Paresthesia of skin: Secondary | ICD-10-CM | POA: Insufficient documentation

## 2023-03-02 DIAGNOSIS — R200 Anesthesia of skin: Secondary | ICD-10-CM | POA: Diagnosis not present

## 2023-03-02 LAB — CBC WITH DIFFERENTIAL/PLATELET
Abs Immature Granulocytes: 0.03 10*3/uL (ref 0.00–0.07)
Basophils Absolute: 0.1 10*3/uL (ref 0.0–0.1)
Basophils Relative: 1 %
Eosinophils Absolute: 0.4 10*3/uL (ref 0.0–0.5)
Eosinophils Relative: 5 %
HCT: 41 % (ref 36.0–46.0)
Hemoglobin: 13.6 g/dL (ref 12.0–15.0)
Immature Granulocytes: 0 %
Lymphocytes Relative: 22 %
Lymphs Abs: 1.6 10*3/uL (ref 0.7–4.0)
MCH: 28.8 pg (ref 26.0–34.0)
MCHC: 33.2 g/dL (ref 30.0–36.0)
MCV: 86.7 fL (ref 80.0–100.0)
Monocytes Absolute: 0.6 10*3/uL (ref 0.1–1.0)
Monocytes Relative: 8 %
Neutro Abs: 4.5 10*3/uL (ref 1.7–7.7)
Neutrophils Relative %: 64 %
Platelets: 237 10*3/uL (ref 150–400)
RBC: 4.73 MIL/uL (ref 3.87–5.11)
RDW: 12.4 % (ref 11.5–15.5)
WBC: 7.1 10*3/uL (ref 4.0–10.5)
nRBC: 0 % (ref 0.0–0.2)

## 2023-03-02 LAB — BASIC METABOLIC PANEL
Anion gap: 9 (ref 5–15)
BUN: 11 mg/dL (ref 6–20)
CO2: 20 mmol/L — ABNORMAL LOW (ref 22–32)
Calcium: 8.9 mg/dL (ref 8.9–10.3)
Chloride: 111 mmol/L (ref 98–111)
Creatinine, Ser: 0.87 mg/dL (ref 0.44–1.00)
GFR, Estimated: >60 mL/min (ref >60)
Glucose, Bld: 116 mg/dL — ABNORMAL HIGH (ref 70–99)
Potassium: 4 mmol/L (ref 3.5–5.1)
Sodium: 140 mmol/L (ref 135–145)

## 2023-03-02 LAB — TROPONIN I (HIGH SENSITIVITY)
Troponin I (High Sensitivity): 3 ng/L (ref <18)
Troponin I (High Sensitivity): 4 ng/L (ref <18)

## 2023-03-02 LAB — CBG MONITORING, ED: Glucose-Capillary: 111 mg/dL — ABNORMAL HIGH (ref 70–99)

## 2023-03-02 LAB — POC URINE PREG, ED: Preg Test, Ur: NEGATIVE

## 2023-03-02 NOTE — ED Triage Notes (Signed)
Pt reports waking up at 0100 this morning and around 0110 had left arm and leg numbness for aprox 30 seconds. Pt denies numbness currently. States her arm feels heavy, denies headache. Speech clear, ambulatory without difficulty.

## 2023-03-02 NOTE — Discharge Instructions (Addendum)
You were seen in the emergency department today for evaluation of your pins and needle sensation over the left side of your body.  Fortunately your testing here was overall reassuring.  As we discussed, you can consider taking a full dose aspirin daily until you are able to follow-up with a primary care doctor to further discuss the recommendations.  Return to the ER for any new or worsening symptoms.

## 2023-03-02 NOTE — ED Provider Notes (Signed)
[facility] Provider Note    Event Date/Time   First MD Initiated Contact with Patient 03/02/23 0421     (approximate)   History   Numbness   HPI  Nancy Mccullough is a 42 year old female with no known past medical history presenting to the emergency department for evaluation of paresthesias.  Around 1 AM this morning patient woke up.  Shortly after she reached over to grab something and noticed that her left arm "numb", but later clarifies that this was a pins and needle sensation.  She then reports that that pins and needle sensation traveled down into her chest and breast area and into her left leg.  She reports that within 30 seconds, symptoms have totally resolved.  Currently denies any ongoing symptoms.  No headache, no history of similar.  No trauma.     Physical Exam   Triage Vital Signs: ED Triage Vitals  Encounter Vitals Group     BP 03/02/23 0220 (!) 152/102     Systolic BP Percentile --      Diastolic BP Percentile --      Pulse Rate 03/02/23 0219 96     Resp 03/02/23 0219 20     Temp 03/02/23 0219 98.7 F (37.1 C)     Temp Source 03/02/23 0219 Oral     SpO2 03/02/23 0219 97 %     Weight 03/02/23 0219 209 lb (94.8 kg)     Height 03/02/23 0219 4\' 11"  (1.499 m)     Head Circumference --      Peak Flow --      Pain Score 03/02/23 0219 0     Pain Loc --      Pain Education --      Exclude from Growth Chart --     Most recent vital signs: Vitals:   03/02/23 0219 03/02/23 0220  BP:  (!) 152/102  Pulse: 96 88  Resp: 20 16  Temp: 98.7 F (37.1 C) 99.3 F (37.4 C)  SpO2: 97% 97%     General: Awake, interactive  CV:  Regular rate, good peripheral perfusion.  Resp:  Unlabored respirations, lungs clear to auscultation Abd:  Nondistended.  Neuro:  Keenly aware, correctly answers month and age, able to blink eyes and squeeze hands, normal horizontal extraocular movements, no visual field loss, normal facial symmetry, no arm or leg motor  drift, no limb ataxia, normal sensation, no aphasia, no dysarthria, no inattention    ED Results / Procedures / Treatments   Labs (all labs ordered are listed, but only abnormal results are displayed) Labs Reviewed  BASIC METABOLIC PANEL - Abnormal; Notable for the following components:      Result Value   CO2 20 (*)    Glucose, Bld 116 (*)    All other components within normal limits  CBG MONITORING, ED - Abnormal; Notable for the following components:   Glucose-Capillary 111 (*)    All other components within normal limits  CBC WITH DIFFERENTIAL/PLATELET  POC URINE PREG, ED  TROPONIN I (HIGH SENSITIVITY)  TROPONIN I (HIGH SENSITIVITY)     EKG EKG independently reviewed interpreted by myself (ER attending) demonstrates:  EKG demonstrate sinus rhythm at a rate of 75, PR 152, QRS 94, QTc 438, no acute ST changes  RADIOLOGY Imaging independently reviewed and interpreted by myself demonstrates:  CT head without acute bleed or other acute findings  PROCEDURES:  Critical Care performed: No  Procedures   MEDICATIONS ORDERED IN ED:  Medications - No data to display   IMPRESSION / MDM / ASSESSMENT AND PLAN / ED COURSE  I reviewed the triage vital signs and the nursing notes.  Differential diagnosis includes, but is not limited to, peripheral nerve palsy, very low suspicion CVA with normal exam now, consideration for TIA though atypical history, arrhythmia, very atypical ACS presentation, low suspicion PE or dissection given complete resolution of symptoms now  Patient's presentation is most consistent with acute presentation with potential threat to life or bodily function.  42 year old female presenting to the emergency department for a brief episode of unilateral paresthesias.  Vital stable on presentation.  Labs reassuring.  Did present shortly after symptoms, so troponin x 2 was obtained which was reassuring.  Head CT negative.  Involvement of arm into torso and leg would  be atypical distribution for CVA/TIA as well as very brief duration.  If TIA, would be low risk per ABCD 2 score.  I did discuss this with the patient.  She is comfortable with discharge home and outpatient follow-up.  We did discuss starting full dose aspirin until she is able to follow-up with her primary care doctor to further discuss options and patient is agreeable. Strict return precautions provided.  Patient discharged stable condition.       FINAL CLINICAL IMPRESSION(S) / ED DIAGNOSES   Final diagnoses:  Paresthesia of left upper and lower extremity     Rx / DC Orders   ED Discharge Orders     None        Note:  This document was prepared using Dragon voice recognition software and may include unintentional dictation errors.   Trinna Post, MD 03/02/23 [phone]

## 2023-04-02 ENCOUNTER — Ambulatory Visit: Payer: MEDICAID

## 2023-04-02 VITALS — BP 126/78 | Ht 59.0 in | Wt 199.0 lb

## 2023-04-02 DIAGNOSIS — Z3042 Encounter for surveillance of injectable contraceptive: Secondary | ICD-10-CM

## 2023-04-02 DIAGNOSIS — Z3009 Encounter for other general counseling and advice on contraception: Secondary | ICD-10-CM | POA: Diagnosis not present

## 2023-04-02 DIAGNOSIS — Z30013 Encounter for initial prescription of injectable contraceptive: Secondary | ICD-10-CM

## 2023-04-02 NOTE — Progress Notes (Signed)
 11 Weeks   1 Days since last Depo Voices no concerns today.  Counseled to adhere to 11 to 13 week intervals between depo injections for optimal benefit.  Depo given today per order by MARLA Hick, FNP   dated 04/11/2022.  Tolerated well RUOQ.  Next depo due 06/18/2023 and plans to schedule annual PE at that time,  has reminder card.  Shalese Strahan, RN

## 2023-04-15 ENCOUNTER — Ambulatory Visit: Payer: MEDICAID | Admitting: Family Medicine

## 2023-05-22 ENCOUNTER — Ambulatory Visit: Payer: Self-pay | Admitting: Family Medicine

## 2023-06-25 ENCOUNTER — Encounter: Payer: Self-pay | Admitting: Nurse Practitioner

## 2023-06-25 ENCOUNTER — Ambulatory Visit (LOCAL_COMMUNITY_HEALTH_CENTER): Payer: MEDICAID | Admitting: Family Medicine

## 2023-06-25 VITALS — BP 138/95 | HR 88 | Ht 59.5 in | Wt 181.0 lb

## 2023-06-25 DIAGNOSIS — Z3009 Encounter for other general counseling and advice on contraception: Secondary | ICD-10-CM

## 2023-06-25 DIAGNOSIS — R030 Elevated blood-pressure reading, without diagnosis of hypertension: Secondary | ICD-10-CM | POA: Insufficient documentation

## 2023-06-25 DIAGNOSIS — Z0000 Encounter for general adult medical examination without abnormal findings: Secondary | ICD-10-CM

## 2023-06-25 DIAGNOSIS — Z30013 Encounter for initial prescription of injectable contraceptive: Secondary | ICD-10-CM | POA: Diagnosis not present

## 2023-06-25 MED ORDER — MEDROXYPROGESTERONE ACETATE 150 MG/ML IM SUSP
150.0000 mg | INTRAMUSCULAR | Status: AC
  Administered 2023-06-25: 150 mg via INTRAMUSCULAR

## 2023-06-25 NOTE — Progress Notes (Signed)
 [employer] HE[facility] DEPARTMENT [facility] [address], [address] [place] [address] Main phone: [phone]  Family Planning Visit - Repeat Yearly Visit  Subjective:  Nancy Mccullough is a 43 y.o. G5P0  being seen today for an annual wellness visit and to discuss contraception options. The patient is currently using hormonal injection for pregnancy prevention. Patient does not want a pregnancy in the next year.   Patient reports they are looking for a method with the following characteristics:  High efficacy at preventing pregnancy Minimal bleeding or improved bleeding profile  Patient has the following medical problems:  Patient Active Problem List   Diagnosis Date Noted   Well woman exam (no gynecological exam) 04/11/2022   History of gestational diabetes 07/10/2005    Chief Complaint  Patient presents with   Annual Exam    And depo     HPI Patient reports to clinic to renew prescription for depo and have PE  Patient denies concerns about self. Reports swollen gland in neck, but states she had a sore throat and cough recently and believes this is related to the pollen   Review of Systems  Constitutional:  Negative for weight loss.  Eyes:  Negative for blurred vision.  Respiratory:  Negative for cough and shortness of breath.   Cardiovascular:  Negative for claudication.  Gastrointestinal:  Negative for nausea.  Genitourinary:  Negative for dysuria and frequency.  Skin:  Negative for rash.  Neurological:  Negative for headaches.  Endo/Heme/Allergies:  Does not bruise/bleed easily.    See flowsheet for other program required questions.   Diabetes screening This patient is 43 y.o. with a BMI of Body mass index is 35.95 kg/m.Marland Kitchen  Is patient eligible for diabetes screening (age >35 and BMI >25)?  yes  Was Hgb A1c ordered? Practitioner oversight- forgot to order  STI screening Patient reports 0 of partners in last year.  Does this patient  desire STI screening?  No - declined  Hepatitis C screening Has patient been screened once for HCV in the past?  No  No results found for: "HCVAB"  Does the patient meet criteria for HCV testing? No  (If yes-- Screen for HCV through [facility]) Criteria:  Since the last HCV result, does the patient have any of the following? - Current drug use - Have a partner with drug use - Has been incarcerated  Hepatitis B screening Does the patient meet criteria for HBV testing? No Criteria:  -Household, sexual or needle sharing contact with HBV -History of drug use -HIV positive -Those with known Hep C  Cervical Cancer Screening  Result Date Procedure Results Follow-ups  03/20/2015 Pap IG and HPV (high risk) DNA detection Pap Smear: NILM HPV: HRHPV - Transformation Zone: Present due 03/19/2020  03/20/2015 HM PAP SMEAR HM Pap smear: Negative, HPV negative     Health Maintenance Due  Topic Date Due   Pneumococcal Vaccine 6-66 Years old (1 of 2 - PCV) Never done   HIV Screening  Never done   Hepatitis C Screening  Never done   DTaP/Tdap/Td (1 - Tdap) Never done   Cervical Cancer Screening (HPV/Pap Cotest)  03/19/2018   INFLUENZA VACCINE  Never done   COVID-19 Vaccine (1 - 2024-25 season) Never done    The following portions of the patient's history were reviewed and updated as appropriate: allergies, current medications, past family history, past medical history, past social history, past surgical history and problem list. Problem list updated.  Objective:   Vitals:   06/25/23 1327  BP: (!) 138/95  Pulse: 88  Weight: 181 lb (82.1 kg)  Height: 4' 11.5" (1.511 m)    Physical Exam Exam conducted with a chaperone present Adelina Mings Biomedical scientist).  Constitutional:      Appearance: Normal appearance.  HENT:     Head: Normocephalic and atraumatic.  Pulmonary:     Effort: Pulmonary effort is normal.  Chest:  Breasts:    Tanner Score is 5.     Right: Normal. No mass, nipple  discharge, skin change or tenderness.     Left: Normal. No mass, nipple discharge, skin change or tenderness.  Abdominal:     Palpations: Abdomen is soft.  Musculoskeletal:        General: Normal range of motion.  Lymphadenopathy:     Upper Body:     Right upper body: No axillary adenopathy.     Left upper body: No axillary adenopathy.  Skin:    General: Skin is warm and dry.  Neurological:     General: No focal deficit present.     Mental Status: She is alert.  Psychiatric:        Mood and Affect: Mood normal.        Behavior: Behavior normal.     Assessment and Plan:  Nancy Mccullough is a 43 y.o. female G5P0 presenting to the [facility] Department for an yearly wellness and contraception visit  1. Family planning (Primary) Contraception counseling: Reviewed options based on patient desire and reproductive life plan. Patient is interested in Hormonal Injection. This was provided to the patient today.   Risks, benefits, and typical effectiveness rates were reviewed.  Questions were answered.  Written information was also given to the patient to review.    The patient will follow up in  3 months for surveillance.  The patient was told to call with any further questions, or with any concerns about this method of contraception.  Emphasized use of condoms 100% of the time for STI prevention.  Educated on ECP and assessed need for ECP. Not indicated- last sex Jan 2023  - medroxyPROGESTERone (DEPO-PROVERA) injection 150 mg  2. Well woman exam (no gynecological exam) CBE today (normal), has never had a mammogram -counseled patient that she needs to get a PCP who can order mammo for her -reviewed that patient is due for pap smear today- declined , reports she has too much going on right now and will return for pap   3. Elevated blood pressure reading in office without diagnosis of hypertension -BP elevated today 138/95, and 140/92 -endorses stress, states she is moving,  and believes this is why it is elevated -counseled that as we age HTN is more common- and encouraged pt to get a PCP for eval and possible med management     Return in about 3 months (around 09/25/2023) for depo injection.  No future appointments.  Lenice Llamas, [place]

## 2023-06-25 NOTE — Progress Notes (Signed)
 Pt is here for family planning visit.  Family planning packet reviewed and given to pt.  Depo given in [facility], tolerated well and reminder card given. Gaspar Garbe, RN

## 2023-10-05 ENCOUNTER — Ambulatory Visit: Payer: MEDICAID

## 2023-10-28 ENCOUNTER — Ambulatory Visit: Payer: MEDICAID

## 2023-11-16 ENCOUNTER — Ambulatory Visit: Payer: MEDICAID

## 2023-11-16 VITALS — BP 135/93 | HR 100 | Ht 59.0 in | Wt 150.4 lb

## 2023-11-16 DIAGNOSIS — Z3042 Encounter for surveillance of injectable contraceptive: Secondary | ICD-10-CM

## 2023-11-16 DIAGNOSIS — Z3009 Encounter for other general counseling and advice on contraception: Secondary | ICD-10-CM | POA: Diagnosis not present

## 2023-11-16 DIAGNOSIS — Z3202 Encounter for pregnancy test, result negative: Secondary | ICD-10-CM | POA: Diagnosis not present

## 2023-11-16 DIAGNOSIS — Z30013 Encounter for initial prescription of injectable contraceptive: Secondary | ICD-10-CM | POA: Diagnosis not present

## 2023-11-16 LAB — PREGNANCY, URINE: Preg Test, Ur: NEGATIVE

## 2023-11-16 MED ORDER — MEDROXYPROGESTERONE ACETATE 150 MG/ML IM SUSP
150.0000 mg | INTRAMUSCULAR | Status: AC
  Administered 2023-11-16 – 2024-03-16 (×2): 150 mg via INTRAMUSCULAR

## 2023-11-16 NOTE — Progress Notes (Signed)
 [facility] Problem Visit  [facility]  Subjective:  Nancy Mccullough is a 43 y.o. being seen today for contraception.  Chief Complaint  Patient presents with   Acute Visit    Restart Depo   HPI Nancy Mccullough is here to restart Depo. Last injection was 20 weeks ago. She has not had any sex in 2 months. Declines STI testing. Declines other symptoms or needs.  Discussed overdue pap test - last was in 2016. Patient declines today. States she will schedule well woman exam visit for this. Discussed importance/purpose of cervical cancer screening.  Health Maintenance Due  Topic Date Due   HIV Screening  Never done   Hepatitis C Screening  Never done   DTaP/Tdap/Td (1 - Tdap) Never done   Pneumococcal Vaccine (1 of 2 - PCV) Never done   Hepatitis B Vaccines 19-59 Average Risk (1 of 3 - 19+ 3-dose series) Never done   HPV VACCINES (1 - 3-dose SCDM series) Never done   Cervical Cancer Screening (HPV/Pap Cotest)  03/19/2018   COVID-19 Vaccine (1 - 2024-25 season) Never done   INFLUENZA VACCINE  10/30/2023   The following portions of the patient's history were reviewed and updated as appropriate: allergies, current medications, past family history, past medical history, past social history, past surgical history and problem list. Problem list updated.  See flowsheet for other program required questions.  Objective:  There were no vitals filed for this visit.  Physical Exam Constitutional:      Appearance: Normal appearance.  HENT:     Head: Normocephalic.     Mouth/Throat:     Mouth: Mucous membranes are moist.  Eyes:     General: No scleral icterus.       Right eye: No discharge.        Left eye: No discharge.  Pulmonary:     Effort: Pulmonary effort is normal.  Skin:    General: Skin is warm and dry.  Neurological:     General: No focal deficit present.     Mental Status: She is alert.  Psychiatric:        Mood and Affect: Mood normal.         Behavior: Behavior normal.    Assessment and Plan:  Nancy Mccullough is a 43 y.o. female presenting to the [facility] Department for a Women's Health problem visit  1. Encounter for surveillance of injectable contraceptive (Primary)  - Pregnancy, urine negative today - medroxyPROGESTERone  (DEPO-PROVERA ) injection 150 mg   Return in about 3 months (around 02/16/2024) for depo. Return as soon as able for full physical exam, pap, referral for mammogram.  No future appointments.  Damien FORBES Satchel, NP

## 2023-11-16 NOTE — Progress Notes (Signed)
 Pt here to restart Depo.  Currently pt is 20 weeks 4 days post last Depo injection.  Last intercourse approx 2 mos ago.  Urine pregnancy test negative.  Depo Provera  150mg  Im in [facility] without difficulty.  Pt counseled to use condoms as back up method x 7 days.  Condoms declined. Depo reminder card given.-Adelena Desantiago, RN

## 2024-02-29 ENCOUNTER — Ambulatory Visit: Payer: MEDICAID

## 2024-03-11 ENCOUNTER — Ambulatory Visit: Payer: MEDICAID

## 2024-03-16 ENCOUNTER — Ambulatory Visit: Payer: MEDICAID

## 2024-03-16 VITALS — BP 150/110 | HR 89 | Ht 59.0 in | Wt 149.8 lb

## 2024-03-16 DIAGNOSIS — R030 Elevated blood-pressure reading, without diagnosis of hypertension: Secondary | ICD-10-CM

## 2024-03-16 DIAGNOSIS — Z3009 Encounter for other general counseling and advice on contraception: Secondary | ICD-10-CM

## 2024-03-16 DIAGNOSIS — Z309 Encounter for contraceptive management, unspecified: Secondary | ICD-10-CM | POA: Diagnosis not present

## 2024-03-16 DIAGNOSIS — Z30013 Encounter for initial prescription of injectable contraceptive: Secondary | ICD-10-CM | POA: Diagnosis not present

## 2024-03-16 NOTE — Progress Notes (Signed)
 Pt is here for Contraception. Depo IM injection given to pt at the [facility] and pt tolerated well to the injection with no complications. Condoms declined and reminder card given. Opportunity given to Patient to ask questions for any clarifications, questions answered. Wilkie Drought, RN.

## 2024-03-16 NOTE — Progress Notes (Signed)
 [facility] [facility] DEPARTMENT [facility] [address] [place] [address] Main phone: [phone]  Women's Health Problem Visit   Subjective:  Nancy Mccullough is a 43 y.o. being seen today for late Depo.   Chief Complaint  Patient presents with   Contraception   Acute Visit    Depo    HPI: Nancy Mccullough is here for Depo today, 1 week late. No periods. Last Depo was [redacted]w[redacted]d. No sex in around 45 days. She is overdue for a pap test, last in 2016  Health Maintenance Due  Topic Date Due   HIV Screening  Never done   Hepatitis C Screening  Never done   DTaP/Tdap/Td (1 - Tdap) Never done   Pneumococcal Vaccine (1 of 2 - PCV) Never done   Hepatitis B Vaccines 19-59 Average Risk (1 of 3 - 19+ 3-dose series) Never done   HPV VACCINES (1 - 3-dose SCDM series) Never done   Cervical Cancer Screening (HPV/Pap Cotest)  03/19/2018   Mammogram  Never done   Influenza Vaccine  Never done   COVID-19 Vaccine (1 - 2025-26 season) Never done   ROS  The following portions of the patient's history were reviewed and updated as appropriate: allergies, current medications, past family history, past medical history, past social history, past surgical history and problem list. Problem list updated.  See flowsheet for other program required questions.  Objective:   Vitals:   03/16/24 1454 03/16/24 1527  BP: (!) 161/105 (!) 150/110  Pulse: 89   Weight: 149 lb 12.8 oz (67.9 kg)   Height: 4' 11 (1.499 m)    Physical Exam Constitutional:      Appearance: Normal appearance.  HENT:     Head: Normocephalic.     Mouth/Throat:     Mouth: Mucous membranes are moist.  Eyes:     General: No scleral icterus.       Right eye: No discharge.        Left eye: No discharge.  Pulmonary:     Effort: Pulmonary effort is normal.  Skin:    General: Skin is warm and dry.  Neurological:     General: No focal deficit present.     Mental Status: She is alert.  Psychiatric:         Mood and Affect: Mood normal.        Behavior: Behavior normal.    Assessment and Plan:  Nancy Mccullough is a 43 y.o. female presenting to the [facility] Department for a Women's Health problem visit  1. Family planning (Primary)  - Here for Depo re-start, last injection was 17w 2d ago, no sex in around 45 days, no periods - Discussed importance of being on time for injection for optimal efficacy - Discussed that Depo is not a safe method of contraception with her elevated BP readings. She is in a hurry today, strongly desires Depo and does not have time to discuss alternative methods - Will give today, but discussed if her blood pressure continues to trend at this level we will need to discontinue and discuss other methods - Overdue for a pap test: discussed purpose/importance of cervical cancer screening. Patient declines today.  2. Elevated blood pressure reading in office without diagnosis of hypertension  - BP 161/105, repeat after 15 mins manually 150/100.  - BP at last visit was 135/93, patient states she checks her BP at home and it's better: 115/80. - She reports she rushed here,  drank a large energy drink, and is stressed about having to now get over to [facility] in a hurry - She does not smoke. No chest pain, SOB, blurred vision, headaches. - Advised her to continue checking her BP at home and log her readings, provided PCP list and encouraged her to find a PCP ASAP to discuss her elevated BP and potential treatment  Return in about 11 weeks (around 06/01/2024).  No future appointments.  Damien FORBES Satchel, NP
